# Patient Record
Sex: Male | Born: 1955 | Race: Black or African American | Hispanic: No | Marital: Single
Health system: Southern US, Community
[De-identification: ages and names within clinical notes are randomized; demographics above are authoritative.]

## PROBLEM LIST (undated history)

## (undated) DIAGNOSIS — M329 Systemic lupus erythematosus, unspecified: Secondary | ICD-10-CM

---

## 2004-11-13 ENCOUNTER — Emergency Department: Payer: Self-pay | Admitting: Emergency Medicine

## 2004-11-13 ENCOUNTER — Other Ambulatory Visit: Payer: Self-pay

## 2005-01-06 ENCOUNTER — Emergency Department: Payer: Self-pay | Admitting: Emergency Medicine

## 2005-01-26 ENCOUNTER — Ambulatory Visit: Payer: Self-pay | Admitting: Unknown Physician Specialty

## 2015-11-17 ENCOUNTER — Other Ambulatory Visit: Payer: Self-pay | Admitting: Rheumatology

## 2015-11-17 DIAGNOSIS — R1310 Dysphagia, unspecified: Secondary | ICD-10-CM

## 2015-11-17 DIAGNOSIS — M359 Systemic involvement of connective tissue, unspecified: Secondary | ICD-10-CM

## 2015-11-17 DIAGNOSIS — R1319 Other dysphagia: Secondary | ICD-10-CM

## 2015-11-17 DIAGNOSIS — R634 Abnormal weight loss: Secondary | ICD-10-CM

## 2015-11-24 ENCOUNTER — Ambulatory Visit
Admission: RE | Admit: 2015-11-24 | Discharge: 2015-11-24 | Disposition: A | Discharge status: Home / Self Care | Payer: Medicare Other | Source: Ambulatory Visit | Attending: Rheumatology | Admitting: Rheumatology

## 2015-11-24 DIAGNOSIS — R634 Abnormal weight loss: Secondary | ICD-10-CM | POA: Diagnosis present

## 2015-11-24 DIAGNOSIS — K449 Diaphragmatic hernia without obstruction or gangrene: Secondary | ICD-10-CM | POA: Diagnosis not present

## 2015-11-24 DIAGNOSIS — R1319 Other dysphagia: Secondary | ICD-10-CM

## 2015-11-24 DIAGNOSIS — R1314 Dysphagia, pharyngoesophageal phase: Secondary | ICD-10-CM | POA: Diagnosis present

## 2015-11-24 DIAGNOSIS — R1310 Dysphagia, unspecified: Secondary | ICD-10-CM

## 2015-11-24 DIAGNOSIS — K219 Gastro-esophageal reflux disease without esophagitis: Secondary | ICD-10-CM | POA: Diagnosis not present

## 2015-11-24 DIAGNOSIS — M359 Systemic involvement of connective tissue, unspecified: Secondary | ICD-10-CM | POA: Diagnosis not present

## 2019-11-10 ENCOUNTER — Emergency Department
Admission: EM | Admit: 2019-11-10 | Discharge: 2019-11-10 | Disposition: A | Discharge status: Home / Self Care | Payer: Medicare Other | Attending: Emergency Medicine | Admitting: Emergency Medicine

## 2019-11-10 ENCOUNTER — Emergency Department: Payer: Medicare Other

## 2019-11-10 ENCOUNTER — Other Ambulatory Visit: Payer: Self-pay

## 2019-11-10 DIAGNOSIS — Y939 Activity, unspecified: Secondary | ICD-10-CM | POA: Diagnosis not present

## 2019-11-10 DIAGNOSIS — T17228A Food in pharynx causing other injury, initial encounter: Secondary | ICD-10-CM | POA: Insufficient documentation

## 2019-11-10 DIAGNOSIS — R1310 Dysphagia, unspecified: Secondary | ICD-10-CM | POA: Diagnosis not present

## 2019-11-10 DIAGNOSIS — F458 Other somatoform disorders: Secondary | ICD-10-CM | POA: Insufficient documentation

## 2019-11-10 DIAGNOSIS — R911 Solitary pulmonary nodule: Secondary | ICD-10-CM | POA: Insufficient documentation

## 2019-11-10 DIAGNOSIS — Y929 Unspecified place or not applicable: Secondary | ICD-10-CM | POA: Insufficient documentation

## 2019-11-10 DIAGNOSIS — Y999 Unspecified external cause status: Secondary | ICD-10-CM | POA: Insufficient documentation

## 2019-11-10 DIAGNOSIS — I700 Atherosclerosis of aorta: Secondary | ICD-10-CM | POA: Insufficient documentation

## 2019-11-10 DIAGNOSIS — R0989 Other specified symptoms and signs involving the circulatory and respiratory systems: Secondary | ICD-10-CM

## 2019-11-10 DIAGNOSIS — J439 Emphysema, unspecified: Secondary | ICD-10-CM | POA: Insufficient documentation

## 2019-11-10 DIAGNOSIS — X58XXXA Exposure to other specified factors, initial encounter: Secondary | ICD-10-CM | POA: Diagnosis not present

## 2019-11-10 DIAGNOSIS — J3489 Other specified disorders of nose and nasal sinuses: Secondary | ICD-10-CM | POA: Diagnosis not present

## 2019-11-10 LAB — CBC WITH DIFFERENTIAL/PLATELET
Abs Immature Granulocytes: 0.01 10*3/uL (ref 0.00–0.07)
Basophils Absolute: 0 10*3/uL (ref 0.0–0.1)
Basophils Relative: 0 %
Eosinophils Absolute: 0.1 10*3/uL (ref 0.0–0.5)
Eosinophils Relative: 1 %
HCT: 36.8 % — ABNORMAL LOW (ref 39.0–52.0)
Hemoglobin: 12.4 g/dL — ABNORMAL LOW (ref 13.0–17.0)
Immature Granulocytes: 0 %
Lymphocytes Relative: 36 %
Lymphs Abs: 1.3 10*3/uL (ref 0.7–4.0)
MCH: 29.2 pg (ref 26.0–34.0)
MCHC: 33.7 g/dL (ref 30.0–36.0)
MCV: 86.8 fL (ref 80.0–100.0)
Monocytes Absolute: 0.4 10*3/uL (ref 0.1–1.0)
Monocytes Relative: 11 %
Neutro Abs: 2 10*3/uL (ref 1.7–7.7)
Neutrophils Relative %: 52 %
Platelets: 191 10*3/uL (ref 150–400)
RBC: 4.24 MIL/uL (ref 4.22–5.81)
RDW: 13.9 % (ref 11.5–15.5)
WBC: 3.8 10*3/uL — ABNORMAL LOW (ref 4.0–10.5)
nRBC: 0 % (ref 0.0–0.2)

## 2019-11-10 LAB — BASIC METABOLIC PANEL
Anion gap: 9 (ref 5–15)
BUN: 14 mg/dL (ref 8–23)
CO2: 26 mmol/L (ref 22–32)
Calcium: 9 mg/dL (ref 8.9–10.3)
Chloride: 99 mmol/L (ref 98–111)
Creatinine, Ser: 0.91 mg/dL (ref 0.61–1.24)
GFR calc Af Amer: >60 mL/min (ref >60)
GFR calc non Af Amer: >60 mL/min (ref >60)
Glucose, Bld: 127 mg/dL — ABNORMAL HIGH (ref 70–99)
Potassium: 3.5 mmol/L (ref 3.5–5.1)
Sodium: 134 mmol/L — ABNORMAL LOW (ref 135–145)

## 2019-11-10 MED ORDER — GLUCAGON HCL RDNA (DIAGNOSTIC) 1 MG IJ SOLR
1.0000 mg | Freq: Once | INTRAMUSCULAR | Status: AC
Start: 2019-11-10 — End: 2019-11-10
  Administered 2019-11-10: 1 mg via INTRAMUSCULAR
  Filled 2019-11-10 (×2): qty 1

## 2019-11-10 MED ORDER — IOHEXOL 300 MG/ML  SOLN
100.0000 mL | Freq: Once | INTRAMUSCULAR | Status: AC | PRN
  Administered 2019-11-10: 100 mL via INTRAVENOUS
  Filled 2019-11-10: qty 100

## 2019-11-10 MED ORDER — SUCRALFATE 1 GM/10ML PO SUSP
1.0000 g | Freq: Four times a day (QID) | ORAL | 0 refills | Status: DC
Start: 2019-11-10 — End: 2019-11-14

## 2019-11-10 MED ORDER — IOHEXOL 300 MG/ML  SOLN
75.0000 mL | Freq: Once | INTRAMUSCULAR | Status: DC | PRN
  Filled 2019-11-10: qty 75

## 2019-11-10 MED ORDER — OMEPRAZOLE 40 MG PO CPDR: 40.0000 mg | DELAYED_RELEASE_CAPSULE | Freq: Two times a day (BID) | ORAL | 0 refills | Status: DC

## 2019-11-10 NOTE — ED Notes (Signed)
See triage note Presents with possible f/b in throat  States he think he may have a piece on meat stuck    Pt is able to speak full sentences and swallow

## 2019-11-10 NOTE — ED Provider Notes (Signed)
[facility] Emergency Department Provider Note ____________________________________________   First MD Initiated Contact with Patient 11/10/19 1639     (approximate)  I have reviewed the triage vital signs and the nursing notes.   HISTORY  Chief Complaint Foreign Body  HPI FREDDERICK Serrano is a 64 y.o. male with a history of esophageal stricture presents to the emergency department for treatment and evaluation due to globus sensation.  Patient states that he ate steak last night and now feels that he has a piece stuck will go down.  He is still able to drink and eat soft foods but is not able to eat anything solid.  Last procedure to stretch the esophagus was approximately 5 years ago.      History reviewed. No pertinent past medical history.  There are no problems to display for this patient.   Prior to Admission medications   Medication Sig Start Date End Date Taking? Authorizing Provider  Etanercept (ENBREL [place]) Inject into the skin.   Yes [provider]  omeprazole (PRILOSEC) 40 MG capsule Take 1 capsule (40 mg total) by mouth 2 (two) times daily before a meal. 11/10/19 12/10/19  Livvy Spilman B, FNP  sucralfate (CARAFATE) 1 GM/10ML suspension Take 10 mLs (1 g total) by mouth 4 (four) times daily. 11/10/19 11/09/20  Chinita Pester, FNP    Allergies Patient has no known allergies.  History reviewed. No pertinent family history.  Social History Social History   Tobacco Use   Smoking status: Not on file  Substance Use Topics   Alcohol use: Not on file   Drug use: Not on file    Review of Systems  Constitutional: No fever/chills. Globus sensation just below sternal notch. Eyes: No visual changes. ENT: No sore throat. Cardiovascular: Denies chest pain. Respiratory: Denies shortness of breath. Gastrointestinal: No abdominal pain.  No nausea, no vomiting.  No diarrhea.  No constipation. Genitourinary: Negative for  dysuria. Musculoskeletal: Negative for back pain. Skin: Negative for rash. Neurological: Negative for headaches, focal weakness or numbness. ____________________________________________   PHYSICAL EXAM:  VITAL SIGNS: ED Triage Vitals [11/10/19 1609]  Enc Vitals Group     BP 138/88     Pulse Rate 60     Resp 18     Temp 98.2 F (36.8 C)     Temp Source Oral     SpO2 97 %     Weight 143 lb (64.9 kg)     Height 5\' 11"  (1.803 m)     Head Circumference      Peak Flow      Pain Score 4     Pain Loc      Pain Edu?      Excl. in GC?     Constitutional: Alert and oriented. Well appearing and in no acute distress. Controlling secretions. Eyes: Conjunctivae are normal. Head: Atraumatic. Nose: No congestion/rhinnorhea. Mouth/Throat: Mucous membranes are moist.  Oropharynx non-erythematous. Neck: No stridor.   Hematological/Lymphatic/Immunilogical: No cervical lymphadenopathy. Cardiovascular: Normal rate, regular rhythm. Grossly normal heart sounds.  Good peripheral circulation. Respiratory: Normal respiratory effort.  No retractions. Lungs CTAB. Gastrointestinal: Soft and nontender. No distention. No abdominal bruits. Genitourinary:  Musculoskeletal: No lower extremity tenderness nor edema.  No joint effusions. Neurologic:  Normal speech and language. No gross focal neurologic deficits are appreciated. No gait instability. Skin:  Skin is warm, dry and intact. No rash noted. Psychiatric: Mood and affect are normal. Speech and behavior are normal.  ____________________________________________  LABS (all labs ordered are listed, but only abnormal results are displayed)  Labs Reviewed  BASIC METABOLIC PANEL - Abnormal; Notable for the following components:      Result Value   Sodium 134 (*)    Glucose, Bld 127 (*)    All other components within normal limits  CBC WITH DIFFERENTIAL/PLATELET - Abnormal; Notable for the following components:   WBC 3.8 (*)    Hemoglobin 12.4 (*)     HCT 36.8 (*)    All other components within normal limits   ____________________________________________  EKG  Not indicated. ____________________________________________  RADIOLOGY  ED MD interpretation:    CT is negative for esophageal foreign body.  Esophagus appears mostly decompressed.  No evidence of retropharyngeal soft tissue swelling or epiglottic enlargement.  Official radiology report(s): DG Neck Soft Tissue  Result Date: 11/10/2019 CLINICAL DATA:  Foreign body sensation EXAM: NECK SOFT TISSUES - 1+ VIEW COMPARISON:  None. FINDINGS: There is no evidence of retropharyngeal soft tissue swelling or epiglottic enlargement. The cervical airway is unremarkable and no radio-opaque foreign body identified. Advanced degenerative changes at multiple levels of the cervical spine. IMPRESSION: Negative. Electronically Signed   By: Jasmine Pang M.D.   On: 11/10/2019 16:52   CT Chest W Contrast  Result Date: 11/10/2019 CLINICAL DATA:  Possible foreign body, difficulty swallowing steak EXAM: CT CHEST WITH CONTRAST TECHNIQUE: Multidetector CT imaging of the chest was performed during intravenous contrast administration. CONTRAST:  OMNIPAQUE IOHEXOL 300 MG/ML  SOLN COMPARISON:  Radiograph 11/10/2019 FINDINGS: Cardiovascular: Nonaneurysmal aorta. 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 Mild aortic atherosclerosis. Normal heart size. No pericardial effusion Mediastinum/Nodes: Midline trachea. No thyroid mass. No suspicious adenopathy. Esophagus within normal limits. No esophageal distension or fluid levels. No esophageal wall thickening. Negative for mediastinal air. Lungs/Pleura: Mild emphysema. No consolidation or pleural effusion. Scarring or atelectasis in the right greater than left lung base. Tiny pulmonary nodules measuring up to 3 mm at the right apex. Upper Abdomen: No acute abnormality. Musculoskeletal: No chest wall abnormality. No  acute or significant osseous findings. IMPRESSION: 1. No definitive evidence for esophageal foreign body, esophagus appears mostly decompressed. 2. Mild emphysema with scarring in the lower lobes. Tiny pulmonary nodules measuring up to 3 mm. No follow-up needed if patient is low-risk (and has no known or suspected primary neoplasm). Non-contrast chest CT can be considered in 12 months if patient is high-risk. This recommendation follows the consensus statement: Guidelines for Management of Incidental Pulmonary Nodules Detected on CT Images: From the Fleischner Society 2017; Radiology 2017; 284:228-243. Aortic Atherosclerosis (ICD10-I70.0) and Emphysema (ICD10-J43.9). Electronically Signed   By: Jasmine Pang M.D.   On: 11/10/2019 19:51    ____________________________________________   PROCEDURES  Procedure(s) performed (including Critical Care):  Procedures  ____________________________________________   INITIAL IMPRESSION / ASSESSMENT AND PLAN     64 year old male presenting to the emergency department for evaluation due to feeling that he has a piece of steak stuck in his esophagus.  He states that he feels it just below the sternal notch.  He states that he has tried drinking carbonated fluids and water without success.  He has had a history of esophageal stricture.  While awaiting ER room assignment, DG image of the neck was obtained that does not show any acute retained foreign body that is radiopaque.  Plan will be to give him some IM glucagon and reevaluate in approximately 1 hour.  DIFFERENTIAL DIAGNOSIS  Esophageal stricture,  retained foreign body in the esophagus, esophageal perforation  ED COURSE  No relief with IM glucagon.  Plan will be to get a CT of the soft tissue neck as well as chest since the patient feels that it is stuck below the sternal notch.  Screening labs ordered.  ----------------------------------------- 8:30 PM on  11/10/2019 -----------------------------------------  CT is negative for esophageal foreign body.  Esophagus appears mostly decompressed.  No evidence of retropharyngeal soft tissue swelling or epiglottic enlargement.  Case was discussed with Dr. Allegra Lai who is on-call for gastroenterology.  Plan will be to prescribe him omeprazole 40 mg twice daily and sucralfate liquid.  He will undergo EGD this week.  Results and plan discussed with the patient who agrees.  He will be discharged home with prescriptions sent to his pharmacy as above.  Patient stable in continues to be able to swallow liquids and maintain secretions.  He was advised to return to the emergency department if symptoms worsen and he is unable to get in with GI right away. ____________________________________________   FINAL CLINICAL IMPRESSION(S) / ED DIAGNOSES  Final diagnoses:  Dysphagia, unspecified type  Globus sensation     ED Discharge Orders         Ordered    sucralfate (CARAFATE) 1 GM/10ML suspension  4 times daily     Discontinue  Reprint     11/10/19 2032    omeprazole (PRILOSEC) 40 MG capsule  2 times daily before meals     Discontinue  Reprint     11/10/19 2032           Emeterio Reeve was evaluated in Emergency Department on 11/10/2019 for the symptoms described in the history of present illness. He was evaluated in the context of the global COVID-19 pandemic, which necessitated consideration that the patient might be at risk for infection with the SARS-CoV-2 virus that causes COVID-19. Institutional protocols and algorithms that pertain to the evaluation of patients at risk for COVID-19 are in a state of rapid change based on information released by regulatory bodies including the CDC and federal and state organizations. These policies and algorithms were followed during the patient's care in the ED.   Note:  This document was prepared using Dragon voice recognition software and may include unintentional  dictation errors.   Chinita Pester, FNP 11/10/19 2052    Dionne Bucy, MD 11/10/19 2114

## 2019-11-10 NOTE — ED Triage Notes (Signed)
Pt comes POV after swallowing steak last night and states that it won't go down. Able to drink and keep down water. No trouble breathing.

## 2019-11-11 ENCOUNTER — Telehealth: Payer: Self-pay

## 2019-11-11 ENCOUNTER — Other Ambulatory Visit: Payer: Self-pay

## 2019-11-11 DIAGNOSIS — R1310 Dysphagia, unspecified: Secondary | ICD-10-CM

## 2019-11-11 NOTE — Telephone Encounter (Signed)
Scheduled patient a Urgent EGD on Friday. Informed patient of the instructions and also informed him he had to go for covid test tomorrow. He verbalized understanding of results

## 2019-11-12 ENCOUNTER — Other Ambulatory Visit: Payer: Self-pay

## 2019-11-12 ENCOUNTER — Other Ambulatory Visit
Admission: RE | Admit: 2019-11-12 | Discharge: 2019-11-12 | Disposition: A | Discharge status: Home / Self Care | Payer: Medicare Other | Source: Ambulatory Visit | Attending: Gastroenterology | Admitting: Gastroenterology

## 2019-11-12 DIAGNOSIS — Z20822 Contact with and (suspected) exposure to covid-19: Secondary | ICD-10-CM | POA: Insufficient documentation

## 2019-11-12 DIAGNOSIS — Z01812 Encounter for preprocedural laboratory examination: Secondary | ICD-10-CM | POA: Insufficient documentation

## 2019-11-13 LAB — SARS CORONAVIRUS 2 (TAT 6-24 HRS): SARS Coronavirus 2: NEGATIVE

## 2019-11-14 ENCOUNTER — Ambulatory Visit
Admission: RE | Admit: 2019-11-14 | Discharge: 2019-11-14 | Disposition: A | Discharge status: Home / Self Care | Payer: Medicare Other | Attending: Gastroenterology | Admitting: Gastroenterology

## 2019-11-14 ENCOUNTER — Other Ambulatory Visit: Payer: Self-pay

## 2019-11-14 ENCOUNTER — Ambulatory Visit: Payer: Medicare Other | Admitting: Anesthesiology

## 2019-11-14 ENCOUNTER — Encounter: Payer: Self-pay | Admitting: Gastroenterology

## 2019-11-14 ENCOUNTER — Encounter
Admission: RE | Disposition: A | Discharge status: Home / Self Care | Payer: Self-pay | Source: Home / Self Care | Attending: Gastroenterology

## 2019-11-14 DIAGNOSIS — R1310 Dysphagia, unspecified: Secondary | ICD-10-CM

## 2019-11-14 DIAGNOSIS — R1314 Dysphagia, pharyngoesophageal phase: Secondary | ICD-10-CM | POA: Insufficient documentation

## 2019-11-14 DIAGNOSIS — M329 Systemic lupus erythematosus, unspecified: Secondary | ICD-10-CM | POA: Insufficient documentation

## 2019-11-14 DIAGNOSIS — Z79899 Other long term (current) drug therapy: Secondary | ICD-10-CM | POA: Insufficient documentation

## 2019-11-14 DIAGNOSIS — K449 Diaphragmatic hernia without obstruction or gangrene: Secondary | ICD-10-CM | POA: Diagnosis not present

## 2019-11-14 HISTORY — DX: Systemic lupus erythematosus, unspecified: M32.9

## 2019-11-14 HISTORY — PX: ESOPHAGOGASTRODUODENOSCOPY (EGD) WITH PROPOFOL: SHX5813

## 2019-11-14 SURGERY — ESOPHAGOGASTRODUODENOSCOPY (EGD) WITH PROPOFOL
Anesthesia: General

## 2019-11-14 MED ORDER — LIDOCAINE HCL (PF) 1 % IJ SOLN
INTRAMUSCULAR | Status: AC
  Filled 2019-11-14: qty 2

## 2019-11-14 MED ORDER — PROPOFOL 10 MG/ML IV BOLUS
INTRAVENOUS | Status: DC | PRN
  Administered 2019-11-14: 25 mg via INTRAVENOUS
  Administered 2019-11-14: 50 mg via INTRAVENOUS

## 2019-11-14 MED ORDER — OMEPRAZOLE 40 MG PO CPDR: 40.0000 mg | DELAYED_RELEASE_CAPSULE | Freq: Every day | ORAL | 0 refills | Status: AC

## 2019-11-14 MED ORDER — SODIUM CHLORIDE 0.9 % IV SOLN
INTRAVENOUS | Status: DC
  Administered 2019-11-14: 1000 mL via INTRAVENOUS

## 2019-11-14 MED ORDER — PROPOFOL 500 MG/50ML IV EMUL
INTRAVENOUS | Status: DC | PRN
  Administered 2019-11-14: 125 ug/kg/min via INTRAVENOUS

## 2019-11-14 MED ORDER — LIDOCAINE HCL (CARDIAC) PF 100 MG/5ML IV SOSY
PREFILLED_SYRINGE | INTRAVENOUS | Status: DC | PRN
  Administered 2019-11-14: 30 mg via INTRAVENOUS

## 2019-11-14 MED ORDER — SUCRALFATE 1 GM/10ML PO SUSP: 1.0000 g | ORAL | 0 refills | Status: AC | PRN

## 2019-11-14 MED ORDER — PROPOFOL 500 MG/50ML IV EMUL
INTRAVENOUS | Status: AC
  Filled 2019-11-14: qty 50

## 2019-11-14 NOTE — Op Note (Signed)
[facility] Gastroenterology Patient Name: Kenneth Serrano Procedure Date: 11/14/2019 12:58 PM MRN: [id number] Account #: [id number] Date of Birth: Feb 26, 1956 Admit Type: Outpatient Age: 64 Room: [facility] ENDO ROOM 2 Gender: Male Note Status: Finalized Procedure:             Upper GI endoscopy Indications:           Esophageal dysphagia Providers:             Lin Landsman MD, MD Referring MD:          Marguerita Merles, MD (Referring MD) Medicines:             Monitored Anesthesia Care Complications:         No immediate complications. Estimated blood loss: None. Procedure:             Pre-Anesthesia Assessment:                        - Prior to the procedure, a History and Physical was                         performed, and patient medications and allergies were                         reviewed. The patient is competent. The risks and                         benefits of the procedure and the sedation options and                         risks were discussed with the patient. All questions                         were answered and informed consent was obtained.                         Patient identification and proposed procedure were                         verified by the physician, the nurse, the                         anesthesiologist, the anesthetist and the technician                         in the pre-procedure area in the procedure room in the                         endoscopy suite. Mental Status Examination: alert and                         oriented. Airway Examination: normal oropharyngeal                         airway and neck mobility. Respiratory Examination:                         clear to auscultation. CV Examination: normal.  Prophylactic Antibiotics: The patient does not require                         prophylactic antibiotics. Prior Anticoagulants: The                         patient has taken no previous anticoagulant or                          antiplatelet agents. ASA Grade Assessment: II - A                         patient with mild systemic disease. After reviewing                         the risks and benefits, the patient was deemed in                         satisfactory condition to undergo the procedure. The                         anesthesia plan was to use monitored anesthesia care                         (MAC). Immediately prior to administration of                         medications, the patient was re-assessed for adequacy                         to receive sedatives. The heart rate, respiratory                         rate, oxygen saturations, blood pressure, adequacy of                         pulmonary ventilation, and response to care were                         monitored throughout the procedure. The physical                         status of the patient was re-assessed after the                         procedure.                        After obtaining informed consent, the endoscope was                         passed under direct vision. Throughout the procedure,                         the patient's blood pressure, pulse, and oxygen                         saturations were monitored continuously. The Endoscope  was introduced through the mouth, and advanced to the                         second part of duodenum. The upper GI endoscopy was                         accomplished without difficulty. The patient tolerated                         the procedure well. Findings:      The duodenal bulb and second portion of the duodenum were normal.      The entire examined stomach was normal.      The cardia and gastric fundus were normal on retroflexion.      The gastroesophageal junction and examined esophagus were normal.      A small hiatal hernia was present. Impression:            - Normal duodenal bulb and second portion of the                         duodenum.                         - Normal stomach.                        - Normal gastroesophageal junction and esophagus.                        - No specimens collected. Recommendation:        - Discharge patient to home (with escort).                        - Resume previous diet today.                        - Return to my office in 3 months.                        - Use Prilosec (omeprazole) 40 mg PO daily for 3                         months. Procedure Code(s):     --- Professional ---                        [phone], Esophagogastroduodenoscopy, flexible,                         transoral; diagnostic, including collection of                         specimen(s) by brushing or washing, when performed                         (separate procedure) Diagnosis Code(s):     --- Professional ---                        R13.14, Dysphagia, pharyngoesophageal phase CPT copyright 2019 American Medical Association. All rights reserved. The codes documented in this report are preliminary  and upon coder review may  be revised to meet current compliance requirements. Dr. Ulyess Mort Lin Landsman MD, MD 11/14/2019 1:21:12 PM This report has been signed electronically. Number of Addenda: 0 Note Initiated On: 11/14/2019 12:58 PM Estimated Blood Loss:  Estimated blood loss: none.      [facility]

## 2019-11-14 NOTE — Anesthesia Postprocedure Evaluation (Signed)
Anesthesia Post Note  Patient: Kenneth Serrano  Procedure(s) Performed: ESOPHAGOGASTRODUODENOSCOPY (EGD) WITH PROPOFOL (N/A )  Patient location during evaluation: Endoscopy Anesthesia Type: General Level of consciousness: awake and alert Pain management: pain level controlled Vital Signs Assessment: post-procedure vital signs reviewed and stable Respiratory status: spontaneous breathing, nonlabored ventilation, respiratory function stable and patient connected to nasal cannula oxygen Cardiovascular status: blood pressure returned to baseline and stable Postop Assessment: no apparent nausea or vomiting Anesthetic complications: no   No complications documented.   Last Vitals:  Vitals:   11/14/19 1331 11/14/19 1341  BP: 125/82 (!) 149/92  Pulse: 71 (!) 25  Resp: 18 15  Temp:    SpO2: 100% 100%    Last Pain:  Vitals:   11/14/19 1341  TempSrc:   PainSc: 0-No pain                 Lenard Simmer

## 2019-11-14 NOTE — Anesthesia Preprocedure Evaluation (Addendum)
Anesthesia Evaluation  Patient identified by MRN, date of birth, ID band Patient awake    Reviewed: Allergy & Precautions, H&P , NPO status , Patient's Chart, lab work & pertinent test results, reviewed documented beta blocker date and time   History of Anesthesia Complications Negative for: history of anesthetic complications  Airway Mallampati: I  TM Distance: >3 FB Neck ROM: full    Dental  (+) Dental Advidsory Given, Edentulous Upper, Upper Dentures, Edentulous Lower, Lower Dentures   Pulmonary neg pulmonary ROS,    Pulmonary exam normal breath sounds clear to auscultation       Cardiovascular Exercise Tolerance: Good negative cardio ROS Normal cardiovascular exam Rhythm:regular Rate:Normal     Neuro/Psych negative neurological ROS  negative psych ROS   GI/Hepatic Neg liver ROS, GERD  ,  Endo/Other  negative endocrine ROS  Renal/GU negative Renal ROS  negative genitourinary   Musculoskeletal   Abdominal   Peds  Hematology negative hematology ROS (+)   Anesthesia Other Findings Past Medical History: No date: Lupus (systemic lupus erythematosus) (HCC)   Reproductive/Obstetrics negative OB ROS                            Anesthesia Physical Anesthesia Plan  ASA: II  Anesthesia Plan: General   Post-op Pain Management:    Induction: Intravenous  PONV Risk Score and Plan: 2 and Propofol infusion and TIVA  Airway Management Planned: Natural Airway and Nasal Cannula  Additional Equipment:   Intra-op Plan:   Post-operative Plan:   Informed Consent: I have reviewed the patients History and Physical, chart, labs and discussed the procedure including the risks, benefits and alternatives for the proposed anesthesia with the patient or authorized representative who has indicated his/her understanding and acceptance.     Dental Advisory Given  Plan Discussed with:  Anesthesiologist, CRNA and Surgeon  Anesthesia Plan Comments:         Anesthesia Quick Evaluation

## 2019-11-14 NOTE — H&P (Signed)
Arlyss Repress, MD [address]  [address]  [place], [place] [address]  Main: [phone]  Fax: [phone] Pager: [phone]  Primary Care Physician:  Leanna Sato, MD Primary Gastroenterologist:  Dr. Arlyss Repress  Pre-Procedure History & Physical: HPI:  Kenneth Serrano is a 64 y.o. male is here for an upper endoscopy.   Past Medical History:  Diagnosis Date  . Lupus (systemic lupus erythematosus) (HCC)       Prior to Admission medications   Medication Sig Start Date End Date Taking? Authorizing Provider  Etanercept (ENBREL [place]) Inject into the skin.    [provider]  omeprazole (PRILOSEC) 40 MG capsule Take 1 capsule (40 mg total) by mouth 2 (two) times daily before a meal. 11/10/19 12/10/19  Triplett, Cari B, FNP  sucralfate (CARAFATE) 1 GM/10ML suspension Take 10 mLs (1 g total) by mouth 4 (four) times daily. 11/10/19 11/09/20  Chinita Pester, FNP    Allergies as of 11/11/2019  . (No Known Allergies)    No family history on file.  Social History   Socioeconomic History  . Marital status: Single    Spouse name: Not on file  . Number of children: Not on file  . Years of education: Not on file  . Highest education level: Not on file  Occupational History  . Not on file  Tobacco Use  . Smoking status: Not on file  Substance and Sexual Activity  . Alcohol use: Not on file  . Drug use: Not on file  . Sexual activity: Not on file  Other Topics Concern  . Not on file  Social History Narrative  . Not on file   Social Determinants of Health   Financial Resource Strain:   . Difficulty of Paying Living Expenses:   Food Insecurity:   . Worried About Programme researcher, broadcasting/film/video in the Last Year:   . Barista in the Last Year:   Transportation Needs:   . Freight forwarder (Medical):   Marland Kitchen Lack of Transportation (Non-Medical):   Physical Activity:   . Days of Exercise per Week:   . Minutes of Exercise per Session:   Stress:   . Feeling  of Stress :   Social Connections:   . Frequency of Communication with Friends and Family:   . Frequency of Social Gatherings with Friends and Family:   . Attends Religious Services:   . Active Member of Clubs or Organizations:   . Attends Banker Meetings:   Marland Kitchen Marital Status:   Intimate Partner Violence:   . Fear of Current or Ex-Partner:   . Emotionally Abused:   Marland Kitchen Physically Abused:   . Sexually Abused:     Review of Systems: See HPI, otherwise negative ROS  Physical Exam: BP 111/74   Temp 98.4 F (36.9 C) (Temporal)   Ht 5\' 11"  (1.803 m)   Wt 64.9 kg   BMI 19.96 kg/m  General:   Alert,  pleasant and cooperative in NAD Head:  Normocephalic and atraumatic. Neck:  Supple; no masses or thyromegaly. Lungs:  Clear throughout to auscultation.    Heart:  Regular rate and rhythm. Abdomen:  Soft, nontender and nondistended. Normal bowel sounds, without guarding, and without rebound.   Neurologic:  Alert and  oriented x4;  grossly normal neurologically.  Impression/Plan: is here for an endoscopy to be performed for dysphagia  Risks, benefits, limitations, and alternatives regarding  endoscopy have been reviewed  with the patient.  Questions have been answered.  All parties agreeable.   Lannette Donath, MD  11/14/2019, 12:12 PM

## 2019-11-14 NOTE — Transfer of Care (Signed)
Immediate Anesthesia Transfer of Care Note  Patient: Kenneth Serrano  Procedure(s) Performed: ESOPHAGOGASTRODUODENOSCOPY (EGD) WITH PROPOFOL (N/A )  Patient Location: PACU and Endoscopy Unit  Anesthesia Type:General  Level of Consciousness: awake  Airway & Oxygen Therapy: Patient Spontanous Breathing  Post-op Assessment: Report given to RN  Post vital signs: stable  Last Vitals:  Vitals Value Taken Time  BP    Temp    Pulse 55 11/14/19 1322  Resp 19 11/14/19 1322  SpO2 100 % 11/14/19 1322  Vitals shown include unvalidated device data.  Last Pain:  Vitals:   11/14/19 1201  TempSrc: Temporal  PainSc: 0-No pain         Complications: No complications documented.

## 2019-11-17 ENCOUNTER — Encounter: Payer: Self-pay | Admitting: Gastroenterology

## 2020-09-03 IMAGING — CT CT CHEST W/ CM
2 of 3 series · 15 of 36 positions shown, 18 images · IV contrast (omnipaque)
Comparison: Radiograph 11/10/2019

CLINICAL DATA: Possible foreign body, difficulty swallowing steak

EXAM:
CT CHEST WITH CONTRAST
TECHNIQUE: Multidetector CT imaging of the chest was performed during
intravenous contrast administration.
CONTRAST:  100mL OMNIPAQUE IOHEXOL 300 MG/ML  SOLN

[Series 2: axial st · axial · 0.71mm/px · z∈[-422,-154]mm · 12 of 158 slices shown, 15 images]
[im 12/158  mediastinal]
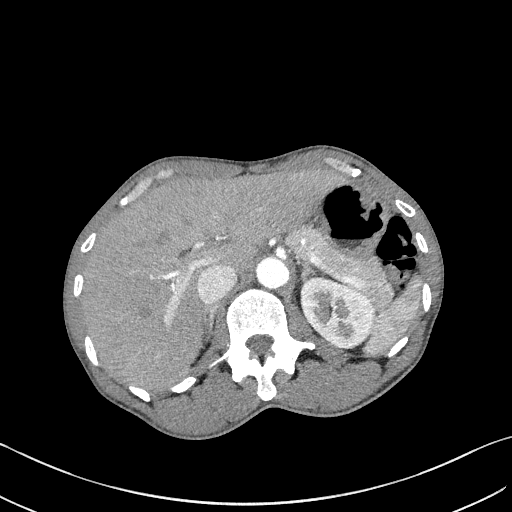
[im 12/158  lung]
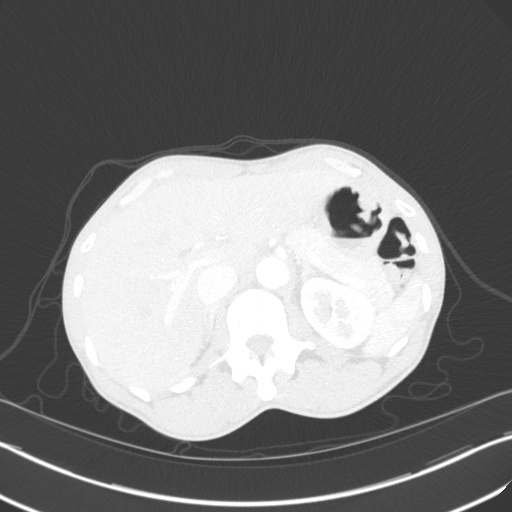
[im 24/158  lung]
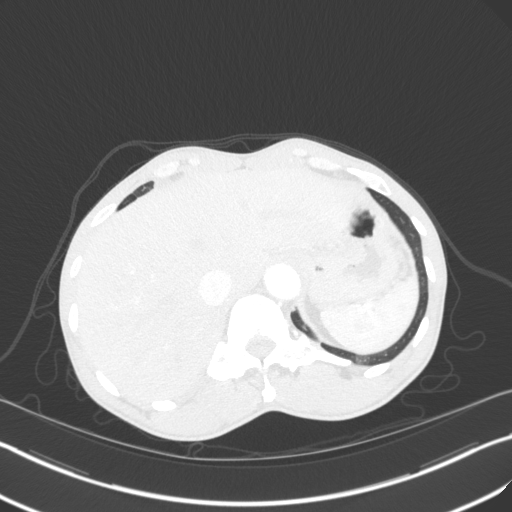
[im 35/158  lung]
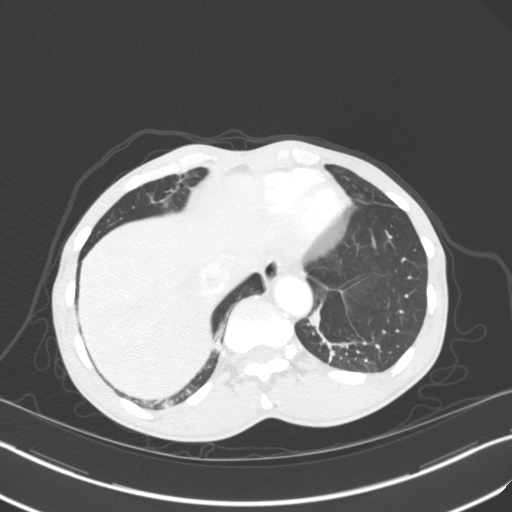
[im 47/158  lung]
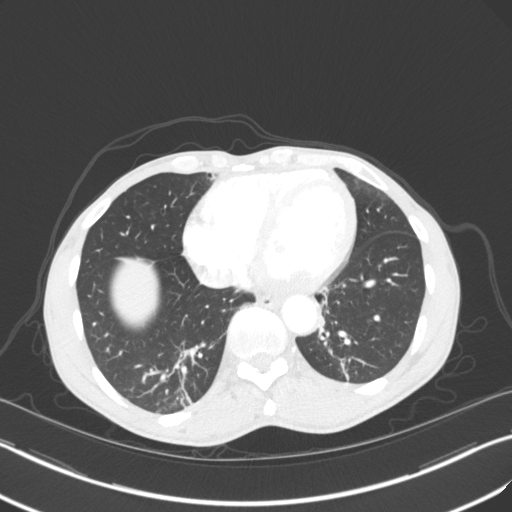
[im 59/158  mediastinal]
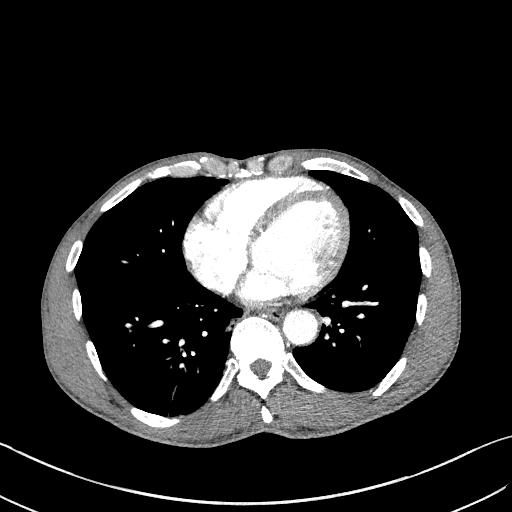
[im 59/158  lung]
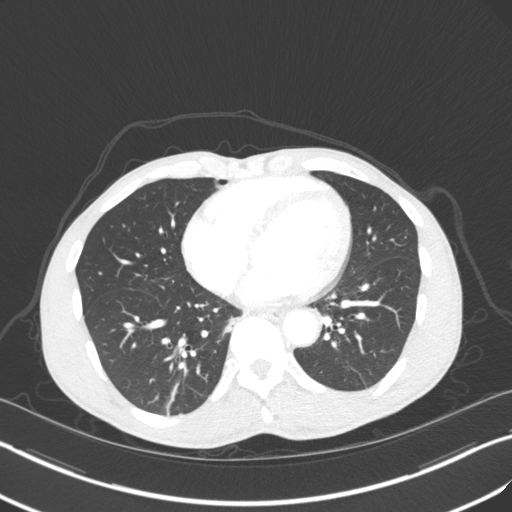
[im 70/158  lung]
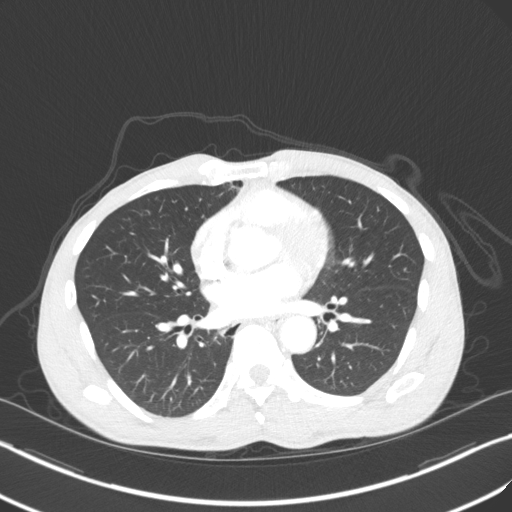
[im 88/158  lung]
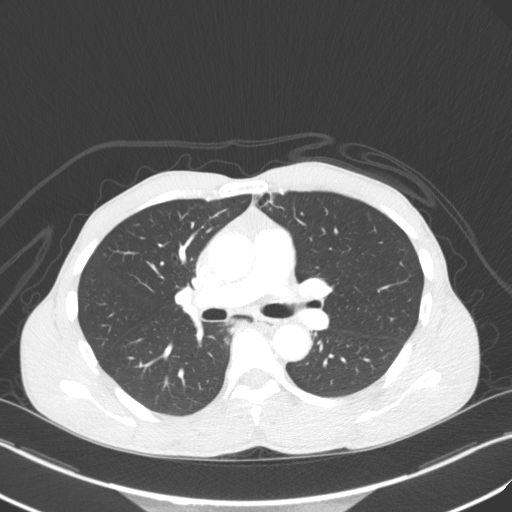
[im 99/158  lung]
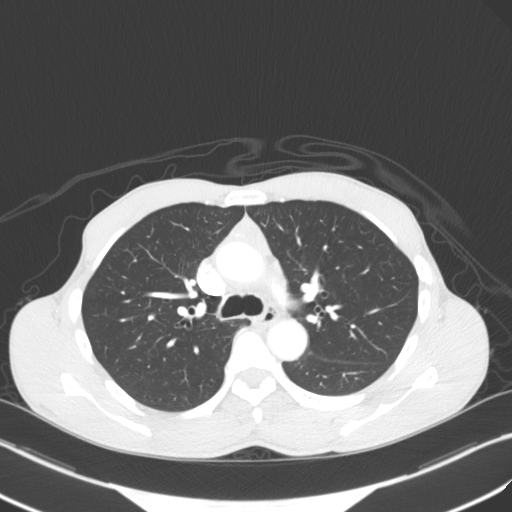
[im 111/158  mediastinal]
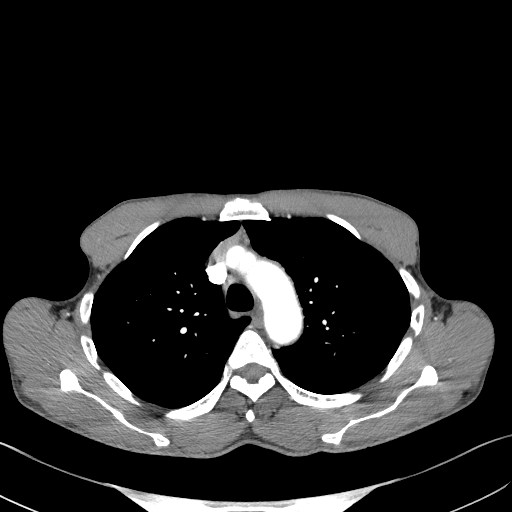
[im 111/158  lung]
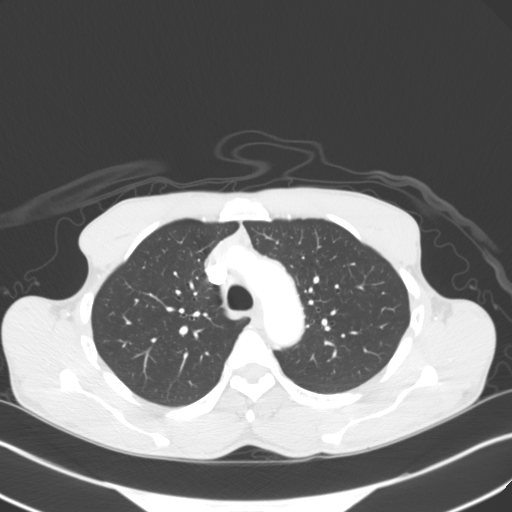
[im 123/158  lung]
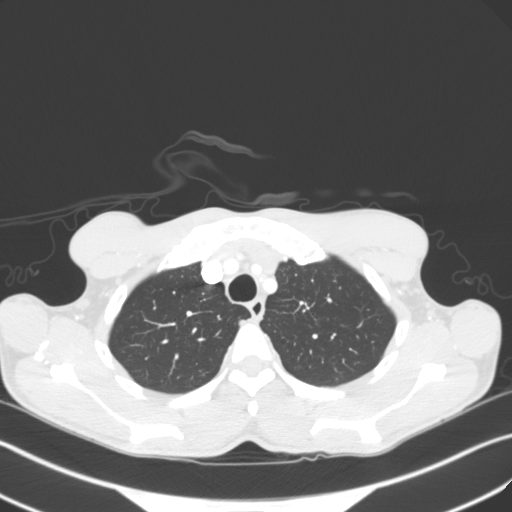
[im 134/158  lung]
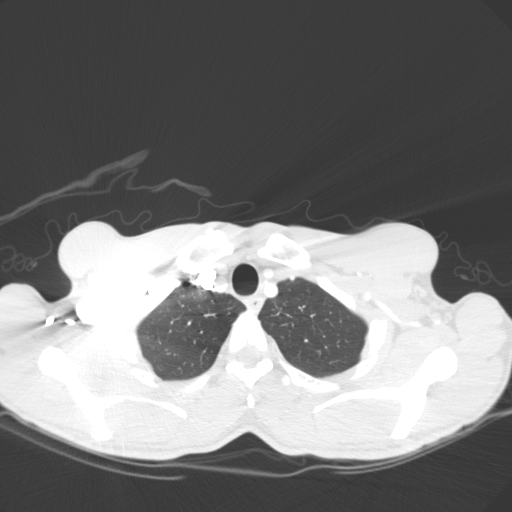
[im 146/158  lung]
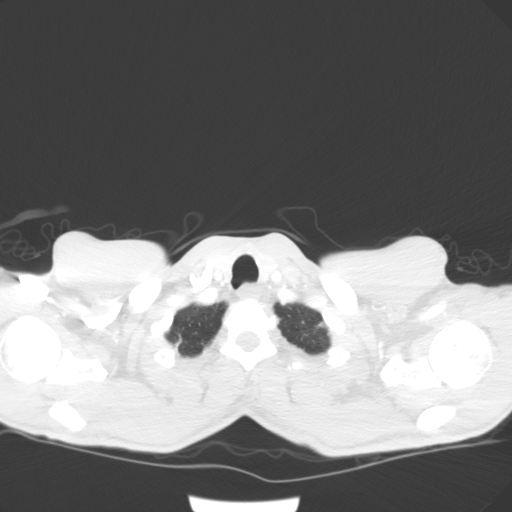

[Series 5: coronal · coronal · 0.67mm/px · 3 of 114 slices shown]
[im 23/114  lung]
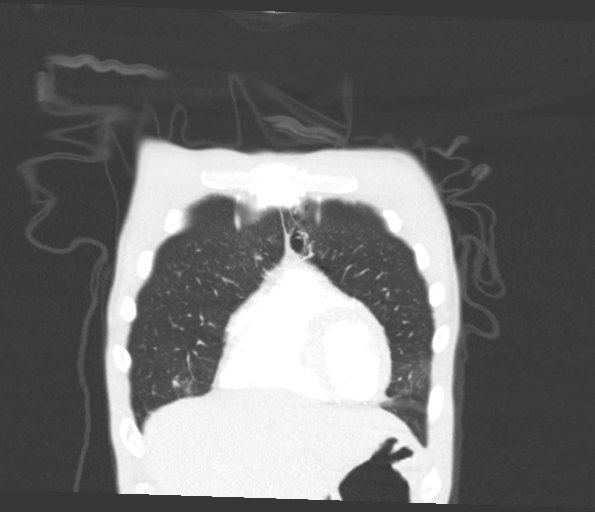
[im 46/114  lung]
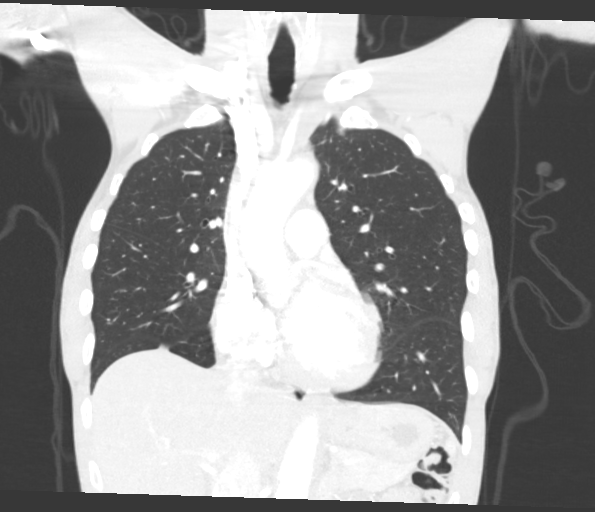
[im 68/114  lung]
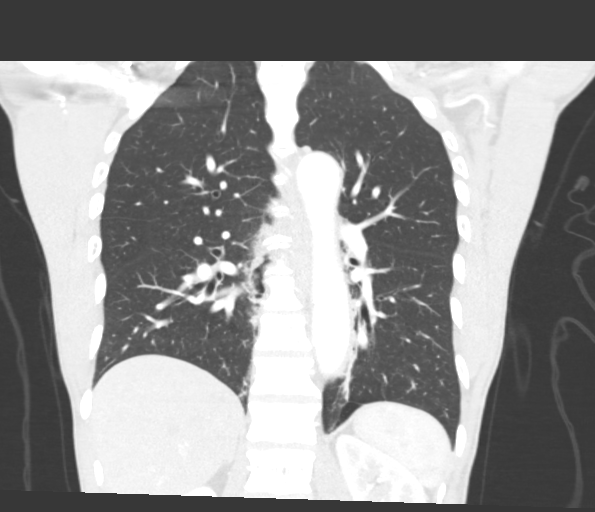

[15 of 36 positions shown; findings below may reference images not displayed]

FINDINGS: Cardiovascular: Nonaneurysmal aorta. Left-sided aortic arch with
direct origin of the left vertebral artery from the arch. Mild
aortic atherosclerosis. Normal heart size. No pericardial effusion

Mediastinum/Nodes: Midline trachea. No thyroid mass. No suspicious
adenopathy. Esophagus within normal limits. No esophageal distension
or fluid levels. No esophageal wall thickening. Negative for
mediastinal air.

Lungs/Pleura: Mild emphysema. No consolidation or pleural effusion.
Scarring or atelectasis in the right greater than left lung base.
Tiny pulmonary nodules measuring up to 3 mm at the right apex.

Upper Abdomen: No acute abnormality.

Musculoskeletal: No chest wall abnormality. No acute or significant
osseous findings.
IMPRESSION: 1. No definitive evidence for esophageal foreign body, esophagus
appears mostly decompressed.
2. Mild emphysema with scarring in the lower lobes. Tiny pulmonary
nodules measuring up to 3 mm. No follow-up needed if patient is
low-risk (and has no known or suspected primary neoplasm).
Non-contrast chest CT can be considered in 12 months if patient is
high-risk. This recommendation follows the consensus statement:
Guidelines for Management of Incidental Pulmonary Nodules Detected

Aortic Atherosclerosis (IHIHB-CL4.4) and Emphysema (IHIHB-0NV.4).
